# Patient Record
Sex: Male | Born: 1990 | Race: White | Hispanic: No | Marital: Single | Smoking: Never smoker
Health system: Southern US, Community
[De-identification: ages and names within clinical notes are randomized; demographics above are authoritative.]

---
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
## 2013-12-05 ENCOUNTER — Emergency Department (HOSPITAL_BASED_OUTPATIENT_CLINIC_OR_DEPARTMENT_OTHER): Payer: 59

## 2013-12-05 ENCOUNTER — Emergency Department (HOSPITAL_BASED_OUTPATIENT_CLINIC_OR_DEPARTMENT_OTHER)
Admission: EM | Admit: 2013-12-05 | Discharge: 2013-12-05 | Disposition: A | Discharge status: Home / Self Care | Payer: 59 | Attending: Emergency Medicine | Admitting: Emergency Medicine

## 2013-12-05 ENCOUNTER — Encounter (HOSPITAL_BASED_OUTPATIENT_CLINIC_OR_DEPARTMENT_OTHER): Payer: Self-pay | Admitting: Emergency Medicine

## 2013-12-05 DIAGNOSIS — W19XXXA Unspecified fall, initial encounter: Secondary | ICD-10-CM | POA: Insufficient documentation

## 2013-12-05 DIAGNOSIS — S92355A Nondisplaced fracture of fifth metatarsal bone, left foot, initial encounter for closed fracture: Secondary | ICD-10-CM

## 2013-12-05 DIAGNOSIS — Y929 Unspecified place or not applicable: Secondary | ICD-10-CM | POA: Insufficient documentation

## 2013-12-05 DIAGNOSIS — S92309A Fracture of unspecified metatarsal bone(s), unspecified foot, initial encounter for closed fracture: Secondary | ICD-10-CM | POA: Insufficient documentation

## 2013-12-05 DIAGNOSIS — Y9389 Activity, other specified: Secondary | ICD-10-CM | POA: Insufficient documentation

## 2013-12-05 MED ORDER — HYDROCODONE-ACETAMINOPHEN 5-325 MG PO TABS: 2.0000 | ORAL_TABLET | Freq: Four times a day (QID) | ORAL | Status: AC | PRN

## 2013-12-05 NOTE — Discharge Instructions (Signed)
Metatarsal Fracture, Undisplaced A metatarsal fracture is a break in the bone(s) of the foot. These are the bones of the foot that connect your toes to the bones of the ankle. DIAGNOSIS  The diagnoses of these fractures are usually made with X-rays. If there are problems in the forefoot and x-rays are normal a later bone scan will usually make the diagnosis.  TREATMENT AND HOME CARE INSTRUCTIONS  Treatment may or may not include a cast or walking shoe. When casts are needed the use is usually for short periods of time so as not to slow down healing with muscle wasting (atrophy).  Activities should be stopped until further advised by your caregiver.  Wear shoes with adequate shock absorbing capabilities and stiff soles.  Alternative exercise may be undertaken while waiting for healing. These may include bicycling and swimming, or as your caregiver suggests.  It is important to keep all follow-up visits or specialty referrals. The failure to keep these appointments could result in improper bone healing and chronic pain or disability.  Warning: Do not drive a car or operate a motor vehicle until your caregiver specifically tells you it is safe to do so. IF YOU DO NOT HAVE A CAST OR SPLINT:  You may walk on your injured foot as tolerated or advised.  Do not put any weight on your injured foot for as long as directed by your caregiver. Slowly increase the amount of time you walk on the foot as the pain allows or as advised.  Use crutches until you can bear weight without pain. A gradual increase in weight bearing may help.  Apply ice to the injury for 15-20 minutes each hour while awake for the first 2 days. Put the ice in a plastic bag and place a towel between the bag of ice and your skin.  Only take over-the-counter or prescription medicines for pain, discomfort, or fever as directed by your caregiver. SEEK IMMEDIATE MEDICAL CARE IF:   Your cast gets damaged or breaks.  You have  continued severe pain or more swelling than you did before the cast was put on, or the pain is not controlled with medications.  Your skin or nails below the injury turn blue or grey, or feel cold or numb.  There is a bad smell, or new stains or pus-like (purulent) drainage coming from the cast. MAKE SURE YOU:   Understand these instructions.  Will watch your condition.  Will get help right away if you are not doing well or get worse. Document Released: 07/21/2002 Document Revised: 01/21/2012 Document Reviewed: 06/11/2008 [employer] Patient Information 2014 [employer].  

## 2013-12-05 NOTE — ED Provider Notes (Signed)
CSN: [id number]     Arrival date & time 12/05/13  1248 History   First MD Initiated Contact with Patient 12/05/13 1307     Chief Complaint  Patient presents with  . Fall  . Foot Pain   (Consider location/radiation/quality/duration/timing/severity/associated sxs/prior Treatment) Patient is a 23 y.o. male presenting with fall and lower extremity pain.  Fall  Foot Pain   Pt reports L foot pain off and on for 3 weeks since he 'injured it' while working out but denies a fall at that time. He states pain initially improved with rest and then returned after helping his father move. Last night he fall, re-injuring his L foot, complaining of moderate aching pain to lateral foot worse with movement and bearing weight. No ankle pain History reviewed. No pertinent past medical history. History reviewed. No pertinent past surgical history. No family history on file. History  Substance Use Topics  . Smoking status: Never Smoker   . Smokeless tobacco: Not on file  . Alcohol Use: Not on file    Review of Systems All other systems reviewed and are negative except as noted in HPI.   Allergies  Review of patient's allergies indicates no known allergies.  Home Medications  No current outpatient prescriptions on file. BP 158/87  Pulse 76  Temp(Src) 99.1 F (37.3 C) (Oral)  Ht 6\' 7"  (2.007 m)  Wt 315 lb (142.883 kg)  BMI 35.47 kg/m2  SpO2 98% Physical Exam  Nursing note and vitals reviewed. Constitutional: He is oriented to person, place, and time. He appears well-developed and well-nourished.  HENT:  Head: Normocephalic and atraumatic.  Eyes: EOM are normal. Pupils are equal, round, and reactive to light.  Neck: Normal range of motion. Neck supple.  Cardiovascular: Normal rate, normal heart sounds and intact distal pulses.   Pulmonary/Chest: Effort normal and breath sounds normal.  Abdominal: Bowel sounds are normal. He exhibits no distension. There is no tenderness.  Musculoskeletal:  Normal range of motion. He exhibits tenderness (tender over the L lateral foot). He exhibits no edema.  Neurological: He is alert and oriented to person, place, and time. He has normal strength. No cranial nerve deficit or sensory deficit.  Skin: Skin is warm and dry. No rash noted.  Psychiatric: He has a normal mood and affect.    ED Course  Procedures (including critical care time) Labs Review Labs Reviewed - No data to display Imaging Review Dg Foot Complete Left  12/05/2013   CLINICAL DATA:  Fall.  Lateral foot pain and swelling.  EXAM: LEFT FOOT - COMPLETE 3+ VIEW  COMPARISON:  None.  FINDINGS: A nondisplaced fracture is seen involving the proximal fifth metatarsal. No other fractures are identified. Alignment is normal.  IMPRESSION: Nondisplaced fracture of the proximal fifth metatarsal.   Electronically Signed   By: Myles RosenthalJohn  Stahl M.D.   On: 12/05/2013 13:32    EKG Interpretation   None       MDM   1. Nondisplaced fracture of fifth left metatarsal bone     Xray images reviewed with the patient, cam walker. He has his own crutches. Advised non-weight bearing and followup with Ortho.     Charles B. Bernette MayersSheldon, MD 12/05/13 1406
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
## 2014-07-12 ENCOUNTER — Other Ambulatory Visit: Payer: Self-pay | Admitting: Family Medicine

## 2014-07-12 DIAGNOSIS — R7989 Other specified abnormal findings of blood chemistry: Secondary | ICD-10-CM

## 2014-07-12 DIAGNOSIS — R945 Abnormal results of liver function studies: Principal | ICD-10-CM

## 2014-07-23 ENCOUNTER — Ambulatory Visit
Admission: RE | Admit: 2014-07-23 | Discharge: 2014-07-23 | Disposition: A | Discharge status: Home / Self Care | Payer: 59 | Source: Ambulatory Visit | Attending: Family Medicine | Admitting: Family Medicine

## 2014-07-23 DIAGNOSIS — R7989 Other specified abnormal findings of blood chemistry: Secondary | ICD-10-CM

## 2014-07-23 DIAGNOSIS — R945 Abnormal results of liver function studies: Principal | ICD-10-CM

## 2016-11-01 ENCOUNTER — Ambulatory Visit
Admission: RE | Admit: 2016-11-01 | Discharge: 2016-11-01 | Disposition: A | Discharge status: Home / Self Care | Payer: 59 | Source: Ambulatory Visit | Attending: Physician Assistant | Admitting: Physician Assistant

## 2016-11-01 ENCOUNTER — Other Ambulatory Visit: Payer: Self-pay | Admitting: Physician Assistant

## 2016-11-01 DIAGNOSIS — S99922A Unspecified injury of left foot, initial encounter: Secondary | ICD-10-CM
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
